# Patient Record
Sex: Male | Born: 1995 | Hispanic: Yes | Marital: Single | Smoking: Never smoker
Health system: Southern US, Community
[De-identification: ages and names within clinical notes are randomized; demographics above are authoritative.]

---

## 2019-12-25 ENCOUNTER — Emergency Department (HOSPITAL_COMMUNITY)
Admission: EM | Admit: 2019-12-25 | Discharge: 2019-12-25 | Disposition: A | Discharge status: Home / Self Care | Payer: Self-pay | Attending: Emergency Medicine | Admitting: Emergency Medicine

## 2019-12-25 ENCOUNTER — Other Ambulatory Visit: Payer: Self-pay

## 2019-12-25 ENCOUNTER — Emergency Department (HOSPITAL_COMMUNITY): Payer: Self-pay

## 2019-12-25 ENCOUNTER — Encounter (HOSPITAL_COMMUNITY): Payer: Self-pay | Admitting: Emergency Medicine

## 2019-12-25 DIAGNOSIS — I951 Orthostatic hypotension: Secondary | ICD-10-CM

## 2019-12-25 DIAGNOSIS — Z20822 Contact with and (suspected) exposure to covid-19: Secondary | ICD-10-CM | POA: Insufficient documentation

## 2019-12-25 DIAGNOSIS — R059 Cough, unspecified: Secondary | ICD-10-CM

## 2019-12-25 DIAGNOSIS — R9431 Abnormal electrocardiogram [ECG] [EKG]: Secondary | ICD-10-CM

## 2019-12-25 DIAGNOSIS — J069 Acute upper respiratory infection, unspecified: Secondary | ICD-10-CM

## 2019-12-25 LAB — CBC WITH DIFFERENTIAL/PLATELET
Abs Immature Granulocytes: 0.03 10*3/uL (ref 0.00–0.07)
Basophils Absolute: 0.1 10*3/uL (ref 0.0–0.1)
Basophils Relative: 1 %
Eosinophils Absolute: 0.4 10*3/uL (ref 0.0–0.5)
Eosinophils Relative: 3 %
HCT: 46.6 % (ref 39.0–52.0)
Hemoglobin: 15.6 g/dL (ref 13.0–17.0)
Immature Granulocytes: 0 %
Lymphocytes Relative: 14 %
Lymphs Abs: 1.7 10*3/uL (ref 0.7–4.0)
MCH: 28.9 pg (ref 26.0–34.0)
MCHC: 33.5 g/dL (ref 30.0–36.0)
MCV: 86.5 fL (ref 80.0–100.0)
Monocytes Absolute: 0.9 10*3/uL (ref 0.1–1.0)
Monocytes Relative: 8 %
Neutro Abs: 8.9 10*3/uL — ABNORMAL HIGH (ref 1.7–7.7)
Neutrophils Relative %: 74 %
Platelets: 296 10*3/uL (ref 150–400)
RBC: 5.39 MIL/uL (ref 4.22–5.81)
RDW: 12 % (ref 11.5–15.5)
WBC: 11.9 10*3/uL — ABNORMAL HIGH (ref 4.0–10.5)
nRBC: 0 % (ref 0.0–0.2)

## 2019-12-25 LAB — COMPREHENSIVE METABOLIC PANEL
ALT: 20 U/L (ref 0–44)
AST: 23 U/L (ref 15–41)
Albumin: 4.7 g/dL (ref 3.5–5.0)
Alkaline Phosphatase: 51 U/L (ref 38–126)
Anion gap: 14 (ref 5–15)
BUN: 7 mg/dL (ref 6–20)
CO2: 21 mmol/L — ABNORMAL LOW (ref 22–32)
Calcium: 9.3 mg/dL (ref 8.9–10.3)
Chloride: 104 mmol/L (ref 98–111)
Creatinine, Ser: 0.74 mg/dL (ref 0.61–1.24)
GFR calc Af Amer: >60 mL/min (ref >60)
GFR calc non Af Amer: >60 mL/min (ref >60)
Glucose, Bld: 132 mg/dL — ABNORMAL HIGH (ref 70–99)
Potassium: 3.6 mmol/L (ref 3.5–5.1)
Sodium: 139 mmol/L (ref 135–145)
Total Bilirubin: 0.7 mg/dL (ref 0.3–1.2)
Total Protein: 8 g/dL (ref 6.5–8.1)

## 2019-12-25 LAB — TSH: TSH: 0.932 u[IU]/mL (ref 0.350–4.500)

## 2019-12-25 LAB — MAGNESIUM: Magnesium: 1.9 mg/dL (ref 1.7–2.4)

## 2019-12-25 LAB — SARS CORONAVIRUS 2 BY RT PCR (HOSPITAL ORDER, PERFORMED IN ~~LOC~~ HOSPITAL LAB): SARS Coronavirus 2: NEGATIVE

## 2019-12-25 LAB — GROUP A STREP BY PCR: Group A Strep by PCR: DETECTED — AB

## 2019-12-25 LAB — TROPONIN I (HIGH SENSITIVITY): Troponin I (High Sensitivity): <2 ng/L (ref <18)

## 2019-12-25 MED ORDER — LIDOCAINE VISCOUS HCL 2 % MT SOLN
15.0000 mL | Freq: Once | OROMUCOSAL | Status: AC
  Administered 2019-12-25: 15 mL via OROMUCOSAL
  Filled 2019-12-25: qty 15

## 2019-12-25 MED ORDER — SODIUM CHLORIDE 0.9 % IV BOLUS
1000.0000 mL | Freq: Once | INTRAVENOUS | Status: AC
  Administered 2019-12-25: 1000 mL via INTRAVENOUS

## 2019-12-25 MED ORDER — KETOROLAC TROMETHAMINE 30 MG/ML IJ SOLN
30.0000 mg | Freq: Once | INTRAMUSCULAR | Status: AC
  Administered 2019-12-25: 30 mg via INTRAVENOUS
  Filled 2019-12-25: qty 1

## 2019-12-25 NOTE — ED Triage Notes (Signed)
Pt reports not feeling well for the last 3 days, with feeling heart racing, and weakenss. Reports subjective fever. No sick contacts. Denies hx of medical problems. Pt reports taking cold medicine. Pt reports stuffy nose, congestion. No improvement with OTC meds. NAD at present.

## 2019-12-25 NOTE — ED Notes (Signed)
Pt unable to stand for three minutes. Pt's HR elevated to the 140s. Pt asked to lay on bed. Pt's pressure taken right after he laid on the bed from standing for almost three minutes. Pt's pressure was 87/62. Pt stated that he "felt dizzy".

## 2019-12-25 NOTE — Discharge Instructions (Addendum)
You were seen in the ER for headache, sore throat, cough, light headedness  First EKG showed subtle abnormalities, we recommend cardiology follow up.  DO NOT EXERT YOURSELF, EXERCISE OR INCREASE YOUR HEART RATE.  DO NOT DRIVE. If you are not able to see cardiologist please see primary care doctor as soon as possible  Return immediately to ER for chest pain shortness or passing out during exercise   Stay hydrated drink 2 liters every day  Take ibuprofen (advil, motrin) every 6-8 hours for headache, sore throat  Return for worsening symptoms, chest pain, shortness of breath with exertion   Your COVID test is negative. Strep throat test is negative.   Strep test is pending - you will be called if this is positive and antibiotics will be sent to your pharmacy.  If you do not receive a call by the end of the day, assume your test is negative   Lo vieron en la sala de emergencias por dolor de cabeza, dolor de garganta, tos, [employer] anomalas sutiles, recomendamos seguimiento cardiolgico. NO HAGA ESFUERZO, NO HAGA EJERCICIO O AUMENTE SU RITMO CARDACO. NO MANEJES. Tiene riesgo de desmallarse.    Llame a la clinica de cardiologia para hacer cita.  Si no puede ver al cardilogo, consulte al mdico de atencin primaria lo antes posible.  Regrese inmediatamente a la sala de emergencias si tiene dolor en el pecho o si se desmaya durante el ejercicio.  Mantente hidratado beber 2 litros todos los [address] ibuprofeno (advil, motrin) cada 6 a 8 horas para el dolor de [place] y de Advertising copywriter.  Retorno por empeoramiento de los sntomas, dolor en el pecho, dificultad para respirar con el esfuerzo  Su prueba de COVID es negativa.   La prueba de estreptococos est pendiente; se le llamar si da positivo y se le enviarn antibiticos a su farmacia. Si no recibe [employer] al final del da, asuma que su prueba es negativa

## 2019-12-25 NOTE — ED Provider Notes (Addendum)
[facility] EMERGENCY DEPARTMENT Provider Note   CSN: [id number] Arrival date & time: 12/25/19  4967     History Chief Complaint  Patient presents with  . Tachycardia  . Weakness    Tim Henson is a 24 y.o. male presents to the ER for evaluation of feeling generalized weakness.  Describes it as feeling lightheaded like he is going to faint.  Has been feeling poorly for the last 3 days.  Reports associated sore throat with swallowing, dry cough, headaches, decreased appetite.  He has not eaten in the last 24 hours because he has not been hungry.  Has been taken triple X cold medicine over-the-counter for symptoms.  Patient was in the waiting room and had a witnessed syncopal episode.  He tells me that he was sitting down on his chair and suddenly felt weak and fainted.  Currently just feels tired.  Denies associated chest pain, shortness of breath.  Did have some palpitations during the syncopal episode but these have resolved.  Denies associated congestion, rhinorrhea, nausea, vomiting, abdominal pain, changes in bowel movements.  No dysuria or urinary symptoms.  Denies chest pain or shortness of breath.  On vaccinated for COVID-19.  No sick contacts.  No recent travel.  He is originally from [place].  Thinks he was vaccinated fully as a child.  Denies illicit drug use.  Denies alcohol use or tobacco use.  Obtained history directly from patient.  He is Spanish-speaking only.  HPI     No past medical history on file.  There are no problems to display for this patient.   ** The histories are not reviewed yet. Please review them in the "History" navigator section and refresh this SmartLink.     No family history on file.  Social History   Tobacco Use  . Smoking status: Never Smoker  Substance Use Topics  . Alcohol use: Not Currently  . Drug use: Not Currently    Home Medications Prior to Admission medications   Not on File    Allergies    Patient has  no known allergies.  Review of Systems   Review of Systems  Cardiovascular: Positive for palpitations.  Neurological: Positive for syncope and light-headedness.    Physical Exam Updated Vital Signs BP 123/77   Pulse (!) 114   Temp 98.5 F (36.9 C) (Oral)   Resp 19   Ht 5\' 6"  (1.676 m)   Wt 59 kg   SpO2 97%   BMI 20.98 kg/m   Physical Exam Vitals and nursing note reviewed.  Constitutional:      General: He is not in acute distress.    Appearance: He is well-developed.     Comments: NAD.  Nontoxic.  HENT:     Head: Normocephalic and atraumatic.     Right Ear: External ear normal.     Left Ear: External ear normal.     Nose: Nose normal.     Mouth/Throat:     Pharynx: Posterior oropharyngeal erythema present.     Comments: Mild erythema and tonsillar hypertrophy bilaterally, symmetric.  No exudates.  Uvula midline.  No trismus.  Dry lips but moist mucous membranes.  Normal phonation, tolerating secretions. Eyes:     General: No scleral icterus.    Conjunctiva/sclera: Conjunctivae normal.  Cardiovascular:     Rate and Rhythm: Regular rhythm. Tachycardia present.     Heart sounds: Normal heart sounds.     Comments: Regular rhythm.  Tachycardic in the 110s. Pulmonary:  Effort: Pulmonary effort is normal.     Breath sounds: Normal breath sounds.  Abdominal:     Palpations: Abdomen is soft.     Tenderness: There is no abdominal tenderness.  Musculoskeletal:        General: No deformity. Normal range of motion.     Cervical back: Normal range of motion and neck supple.  Skin:    General: Skin is warm and dry.     Capillary Refill: Capillary refill takes less than 2 seconds.  Neurological:     Mental Status: He is alert and oriented to person, place, and time.  Psychiatric:        Behavior: Behavior normal.        Thought Content: Thought content normal.        Judgment: Judgment normal.     ED Results / Procedures / Treatments   Labs (all labs ordered are  listed, but only abnormal results are displayed) Labs Reviewed  CBC WITH DIFFERENTIAL/PLATELET - Abnormal; Notable for the following components:      Result Value   WBC 11.9 (*)    Neutro Abs 8.9 (*)    All other components within normal limits  COMPREHENSIVE METABOLIC PANEL - Abnormal; Notable for the following components:   CO2 21 (*)    Glucose, Bld 132 (*)    All other components within normal limits  SARS CORONAVIRUS 2 BY RT PCR (HOSPITAL ORDER, PERFORMED IN [facility])  GROUP A STREP BY PCR  MAGNESIUM  TSH  TROPONIN I (HIGH SENSITIVITY)    EKG EKG Interpretation  Date/Time:  Monday December 25 2019 08:22:57 EDT Ventricular Rate:  136 PR Interval:  134 QRS Duration: 92 QT Interval:  284 QTC Calculation: 427 R Axis:   12 Text Interpretation: Sinus tachycardia ST & T wave abnormality, consider inferior ischemia Abnormal ECG Confirmed by Blane Ohara [phone]) on 12/25/2019 11:06:32 AM   Radiology DG Chest Portable 1 View  Result Date: 12/25/2019 CLINICAL DATA:  Palpitations EXAM: PORTABLE CHEST 1 VIEW COMPARISON:  None. FINDINGS: The cardiomediastinal contours are within normal limits. The lungs are clear. No pneumothorax or pleural effusion. No acute finding in the visualized skeleton. IMPRESSION: No acute cardiopulmonary finding. Electronically Signed   By: Emmaline Kluver M.D.   On: 12/25/2019 10:58    Procedures Procedures (including critical care time)  Medications Ordered in ED Medications  sodium chloride 0.9 % bolus 1,000 mL (1,000 mLs Intravenous New Bag/Given 12/25/19 1020)  lidocaine (XYLOCAINE) 2 % viscous mouth solution 15 mL (15 mLs Mouth/Throat Given 12/25/19 1056)  ketorolac (TORADOL) 30 MG/ML injection 30 mg (30 mg Intravenous Given 12/25/19 1058)  sodium chloride 0.9 % bolus 1,000 mL (1,000 mLs Intravenous New Bag/Given 12/25/19 1102)    ED Course  I have reviewed the triage vital signs and the nursing notes.  Pertinent labs &  imaging results that were available during my care of the patient were reviewed by me and considered in my medical decision making (see chart for details).  Clinical Course as of Dec 25 1342  Mon Dec 25, 2019  1017 Glucose(!): 132 [CG]  1017 WBC(!): 11.9 [CG]  1137 SARS Coronavirus 2: NEGATIVE [CG]    Clinical Course User Index [CG] Liberty Handy, PA-C   MDM Rules/Calculators/A&P                          24 yo M here for generalized weakness, headache, dry cough, decreased  appetite.  Weakness worse with sitting and standing.  No associated CP, SOB. Unvaccinated for COVID.  EMR, triage and nursing notes reviewed to assist with history and MDM.   Patient reportedly had a near syncope or syncope episode in [place].  Was helped up from the ground by staff and BP was checked 87/43, SpO2 96% and HR 100.  Brought to a room.  Orthostatics positive after 3 min of standing became hypotensive SBP 80s and tachycardia 140s, symptomatic with dizziness.   Exam otherwise actually very benign. VS normal during exam.  Non toxic. Appears tired. Reports weakness but no other symptoms currently.  No cardiac murmurs. Appears slightly dehydrated.  ER work up initiated in triage by Charity fundraiser. I have personally visualized and interpreted these.   ER work up vastly reassuring.    Minimal leukocytosis WBC 11.9. CBG 132. LFTs, creatinine, hemoglobin all normal. CXR without acute CP findings.    Initial EKG with HR 130s with non specific ST abnormalities in inferior/lateral leads, normal intervals.  ?delta wave. Patient endorsing positional light headedness however no Cp, SOB.  Denies previous cardiac symptoms with exertion, syncope.  Discussed and reviewed EKG with EDP Zavits. ?Delta wave. Will repeat EKG after IVF.     Ordered continuous cardiac and pulse ox monitoring.   Will give 2 L IVF, toradol. Will closely monitor, repeat EKG and repeat orthostatics after IVF.  COVID, strep pending.   1258: COVID  surprisingly negative. Trop undetectable.  Repeat rate better on EKG but persistent delta wave.  Again asked patient if he had any exertional symptoms in the past but denies this. No issues with exercise.  Discussed at length importance of following up with cardiology.  Recommended no exercise, driving. Will send Trish with cards a message.  shared with EDP, no indication for admission. Repeat orthostatics improved, normal.  Strep pending at dc will call patient with results.   Final Clinical Impression(s) / ED Diagnoses Final diagnoses:  Orthostatic hypotension  Cough in adult  Abnormal EKG  Viral upper respiratory infection    Rx / DC Orders ED Discharge Orders    None       Liberty Handy, PA-C 12/25/19 1352    Blane Ohara, MD 12/26/19 [phone]

## 2019-12-25 NOTE — ED Notes (Signed)
Discharge instructions discussed with patient via interpreter, discharged from ED in NAD

## 2019-12-25 NOTE — ED Notes (Addendum)
I didn't see pt pass out. I walked by pt checking to see what his name was to see if he need a vitals reassessed. He was not on the list I walked away but  pt seems okay. When I got back up front an employee from upstairs helped him to the ground and drew and I came over to get pt vitals. Pt BP was 87/43 o2 96 and hr 100. Pt was not out long pt did not respond to me because he only speaks spanish. Pt did answer drew when he spoke to him in spanish.

## 2020-06-05 ENCOUNTER — Emergency Department (HOSPITAL_COMMUNITY)
Admission: EM | Admit: 2020-06-05 | Discharge: 2020-06-05 | Disposition: A | Discharge status: Home / Self Care | Payer: Self-pay | Attending: Emergency Medicine | Admitting: Emergency Medicine

## 2020-06-05 ENCOUNTER — Emergency Department (HOSPITAL_COMMUNITY): Payer: Self-pay

## 2020-06-05 ENCOUNTER — Other Ambulatory Visit: Payer: Self-pay

## 2020-06-05 DIAGNOSIS — Z9889 Other specified postprocedural states: Secondary | ICD-10-CM | POA: Insufficient documentation

## 2020-06-05 DIAGNOSIS — R079 Chest pain, unspecified: Secondary | ICD-10-CM | POA: Insufficient documentation

## 2020-06-05 DIAGNOSIS — R110 Nausea: Secondary | ICD-10-CM | POA: Insufficient documentation

## 2020-06-05 LAB — BASIC METABOLIC PANEL
Anion gap: 11 (ref 5–15)
BUN: 10 mg/dL (ref 6–20)
CO2: 23 mmol/L (ref 22–32)
Calcium: 9.5 mg/dL (ref 8.9–10.3)
Chloride: 102 mmol/L (ref 98–111)
Creatinine, Ser: 0.57 mg/dL — ABNORMAL LOW (ref 0.61–1.24)
GFR, Estimated: >60 mL/min (ref >60)
Glucose, Bld: 105 mg/dL — ABNORMAL HIGH (ref 70–99)
Potassium: 3.6 mmol/L (ref 3.5–5.1)
Sodium: 136 mmol/L (ref 135–145)

## 2020-06-05 LAB — CBC
HCT: 44.6 % (ref 39.0–52.0)
Hemoglobin: 15 g/dL (ref 13.0–17.0)
MCH: 29.2 pg (ref 26.0–34.0)
MCHC: 33.6 g/dL (ref 30.0–36.0)
MCV: 86.9 fL (ref 80.0–100.0)
Platelets: 299 10*3/uL (ref 150–400)
RBC: 5.13 MIL/uL (ref 4.22–5.81)
RDW: 11.9 % (ref 11.5–15.5)
WBC: 6.4 10*3/uL (ref 4.0–10.5)
nRBC: 0 % (ref 0.0–0.2)

## 2020-06-05 LAB — TROPONIN I (HIGH SENSITIVITY)
Troponin I (High Sensitivity): 21 ng/L — ABNORMAL HIGH (ref <18)
Troponin I (High Sensitivity): 22 ng/L — ABNORMAL HIGH (ref <18)

## 2020-06-05 NOTE — ED Triage Notes (Signed)
Spanish interpreter used: Pt had an ablation done on Monday, c/o chest pain, and feels like his heart is racing.

## 2020-06-05 NOTE — Discharge Instructions (Addendum)
We recommend that you follow-up with your cardiologist later this week  as scheduled. Try to rest and take it easy.   Return here for new concerns.

## 2020-06-05 NOTE — ED Provider Notes (Signed)
[facility] EMERGENCY DEPARTMENT Provider Note   CSN: [id number] Arrival date & time: 06/05/20  1538     History Chief Complaint  Patient presents with  . Chest Pain    Tim Henson is a 25 y.o. male.  The history is provided by the patient and medical records. The history is limited by a language barrier. A language interpreter was used.  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 Gary Fleet at [facility] presenting to the ED with chest pain.  States this started yesterday and continues today.  He states it feels sharp, like something is stabbing him in the chest.  He states he had brief episode of nausea earlier today when he was trying to eat but he has been eating/drinking today without issue.  He denies SOB.  Pain does not worsen with movement or breathing.  States he did have some palpitations earlier today but none currently.  He denies any other medical problems.  No prior cardiac history aside from WPW and ablation.  Currently, he is eating [employer].  No past medical history on file.  There are no problems to display for this patient.     No family history on file.     Home Medications Prior to Admission medications   Not on File    Allergies    Patient has no known allergies.  Review of Systems   Review of Systems  Cardiovascular: Positive for chest pain.  All other systems reviewed and are negative.   Physical Exam Updated Vital Signs BP 128/82 (BP Location: Right Arm)   Pulse 74   Temp 98.8 F (37.1 C) (Oral)   Resp 20   SpO2 96%   Physical Exam Vitals and nursing note reviewed.  Constitutional:      Appearance: He is well-developed and well-nourished.     Comments: Eating [employer], NAD noted  HENT:     Head: Normocephalic and atraumatic.     Mouth/Throat:     Mouth: Oropharynx is clear and moist.  Eyes:     Extraocular Movements: EOM normal.     Conjunctiva/sclera:  Conjunctivae normal.     Pupils: Pupils are equal, round, and reactive to light.  Cardiovascular:     Rate and Rhythm: Normal rate and regular rhythm.     Heart sounds: Normal heart sounds.  Pulmonary:     Effort: Pulmonary effort is normal.     Breath sounds: Normal breath sounds. No wheezing or rhonchi.  Abdominal:     General: Bowel sounds are normal.     Palpations: Abdomen is soft.  Musculoskeletal:        General: Normal range of motion.     Cervical back: Normal range of motion.     Comments: No leg or calf swelling present  Skin:    General: Skin is warm and dry.  Neurological:     Mental Status: He is alert and oriented to person, place, and time.  Psychiatric:        Mood and Affect: Mood and affect normal.     ED Results / Procedures / Treatments   Labs (all labs ordered are listed, but only abnormal results are displayed) Labs Reviewed  BASIC METABOLIC PANEL - Abnormal; Notable for the following components:      Result Value   Glucose, Bld 105 (*)    Creatinine, Ser 0.57 (*)    All other  components within normal limits  TROPONIN I (HIGH SENSITIVITY) - Abnormal; Notable for the following components:   Troponin I (High Sensitivity) 21 (*)    All other components within normal limits  TROPONIN I (HIGH SENSITIVITY) - Abnormal; Notable for the following components:   Troponin I (High Sensitivity) 22 (*)    All other components within normal limits  CBC    EKG EKG Interpretation  Date/Time:  Wednesday June 05 2020 15:59:51 EST Ventricular Rate:  91 PR Interval:  146 QRS Duration: 88 QT Interval:  328 QTC Calculation: 403 R Axis:   76 Text Interpretation: Normal sinus rhythm Normal ECG Confirmed by Gerhard Munch [phone]) on 06/05/2020 10:44:32 PM   Radiology DG Chest 2 View  Result Date: 06/05/2020 CLINICAL DATA:  One day of chest pain. EXAM: CHEST - 2 VIEW COMPARISON:  None. FINDINGS: The heart size and mediastinal contours are within normal limits. Both  lungs are clear. The visualized skeletal structures are unremarkable. IMPRESSION: No active cardiopulmonary disease. Electronically Signed   By: Maudry Mayhew MD   On: 06/05/2020 16:45    Procedures Procedures   Medications Ordered in ED Medications - No data to display  ED Course  I have reviewed the triage vital signs and the nursing notes.  Pertinent labs & imaging results that were available during my care of the patient were reviewed by me and considered in my medical decision making (see chart for details).    MDM Rules/Calculators/A&P  25 y.o. M here with chest pain.  He is about 48 hours s/p cardiac ablation Monday 06/03/20 at [facility].  He states procedure went well without complications.  Started having pain yesterday and continued today.  States he did have some palpitations earlier today, none currently.  Denies SOB or pleuritic type pain.  He is afebrile, non-toxic, currently eating [employer].  He denies current symptoms.  EKG NSR, no ischemia.  Labs as above-- trops minimally elevated but flat at 21 and 22 respectively.  Suspect this is related to his ablation and not ACS.  He is not tachycardic, hypoxic, no SOB or LE swelling so feel PE and CHF less likely as well.  Feel he is stable for discharge.  He reports he has follow-up with his cardiologist on Friday (in 48 hours) for re-check.  May return here for any new/acute changes.  Final Clinical Impression(s) / ED Diagnoses Final diagnoses:  Chest pain in adult    Rx / DC Orders ED Discharge Orders    None       Garlon Hatchet, PA-C 06/05/20 2259    Gerhard Munch, MD 06/05/20 2320

## 2020-06-19 ENCOUNTER — Emergency Department (HOSPITAL_COMMUNITY)
Admission: EM | Admit: 2020-06-19 | Discharge: 2020-06-20 | Disposition: A | Discharge status: Home / Self Care | Payer: Self-pay | Attending: Emergency Medicine | Admitting: Emergency Medicine

## 2020-06-19 ENCOUNTER — Emergency Department (HOSPITAL_COMMUNITY): Payer: Self-pay

## 2020-06-19 DIAGNOSIS — R110 Nausea: Secondary | ICD-10-CM | POA: Insufficient documentation

## 2020-06-19 DIAGNOSIS — R0602 Shortness of breath: Secondary | ICD-10-CM | POA: Insufficient documentation

## 2020-06-19 DIAGNOSIS — R531 Weakness: Secondary | ICD-10-CM | POA: Insufficient documentation

## 2020-06-19 DIAGNOSIS — R4182 Altered mental status, unspecified: Secondary | ICD-10-CM | POA: Insufficient documentation

## 2020-06-19 MED ORDER — SODIUM CHLORIDE 0.9 % IV BOLUS
500.0000 mL | Freq: Once | INTRAVENOUS | Status: AC
  Administered 2020-06-19: 500 mL via INTRAVENOUS

## 2020-06-19 NOTE — ED Triage Notes (Signed)
Pt BIB his brother who reports that pt became altered today after drinking two beers. Unclear if he used any other substances. Pt awake, not answering questions. Brother reports pt had recent heart surgery, unsure what type of surgery.

## 2020-06-19 NOTE — ED Provider Notes (Signed)
[facility] EMERGENCY DEPARTMENT Provider Note   CSN: [id number] Arrival date & time: 06/19/20  2205     History Chief Complaint  Patient presents with  . Altered Mental Status    Tim Henson is a 25 y.o. male.  Patient with history of WPW, recent successful ablation [facility], 06/04/20), presents with sudden onset of SOB and generalized weakness without syncope that started just prior to arrival. The patient denies any chest pain at onset of symptoms or any pain now. He reports mild nausea. His brother states they went to a bar after work and each had 2 shots of tequila, after which the patient "turned purple" and reported significant dyspnea. He had to be carried to the car because of weakness. No recent fever, cough. He does not smoke. No history of asthma, or albuterol use.   The history is provided by the patient. A language interpreter was used.  Altered Mental Status Associated symptoms: nausea and weakness   Associated symptoms: no fever        No past medical history on file.  There are no problems to display for this patient.  No family history on file.  Social History   Tobacco Use  . Smoking status: Never Smoker  Substance Use Topics  . Alcohol use: Not Currently  . Drug use: Not Currently    Home Medications Prior to Admission medications   Not on File    Allergies    Patient has no known allergies.  Review of Systems   Review of Systems  Constitutional: Negative for chills and fever.  HENT: Negative.   Respiratory: Positive for shortness of breath.   Cardiovascular: Negative.  Negative for chest pain.  Gastrointestinal: Positive for nausea.  Musculoskeletal: Negative.   Skin: Positive for color change.  Neurological: Positive for weakness.    Physical Exam Updated Vital Signs BP 122/75   Pulse 96   Temp 98.3 F (36.8 C) (Oral)   Resp 15   SpO2 97%   Physical Exam Vitals and nursing note reviewed.   Constitutional:      Appearance: He is well-developed.  HENT:     Head: Normocephalic.  Cardiovascular:     Rate and Rhythm: Normal rate and regular rhythm.     Heart sounds: No murmur heard.   Pulmonary:     Effort: Pulmonary effort is normal. No respiratory distress.     Breath sounds: Normal breath sounds. No wheezing, rhonchi or rales.  Abdominal:     General: Bowel sounds are normal.     Palpations: Abdomen is soft.     Tenderness: There is no abdominal tenderness. There is no guarding or rebound.  Musculoskeletal:        General: Normal range of motion.     Cervical back: Normal range of motion and neck supple.     Right lower leg: No edema.     Left lower leg: No edema.  Skin:    General: Skin is warm and dry.     Findings: No rash.  Neurological:     Mental Status: He is alert and oriented to person, place, and time.     ED Results / Procedures / Treatments   Labs (all labs ordered are listed, but only abnormal results are displayed) Labs Reviewed  CBC - Abnormal; Notable for the following components:      Result Value   Platelets 416 (*)    All other components within normal limits  URINALYSIS, ROUTINE W REFLEX MICROSCOPIC - Abnormal; Notable for the following components:   Color, Urine STRAW (*)    Specific Gravity, Urine 1.004 (*)    Hgb urine dipstick SMALL (*)    All other components within normal limits  ETHANOL - Abnormal; Notable for the following components:   Alcohol, Ethyl (B) 243 (*)    All other components within normal limits  COMPREHENSIVE METABOLIC PANEL - Abnormal; Notable for the following components:   Glucose, Bld 115 (*)    Creatinine, Ser 0.58 (*)    Total Protein 8.2 (*)    AST 93 (*)    ALT 196 (*)    All other components within normal limits  RAPID URINE DRUG SCREEN, HOSP PERFORMED  D-DIMER, QUANTITATIVE    EKG EKG Interpretation  Date/Time:  Wednesday June 19 2020 22:15:43 EDT Ventricular Rate:  104 PR  Interval:  158 QRS Duration: 88 QT Interval:  328 QTC Calculation: 431 R Axis:   72 Text Interpretation: Sinus tachycardia Otherwise normal ECG Confirmed by Ross Marcus ([id number]) on 06/19/2020 11:17:30 PM   Radiology DG Chest Portable 1 View  Result Date: 06/19/2020 CLINICAL DATA:  Altered mental status. EXAM: PORTABLE CHEST 1 VIEW COMPARISON:  December 25, 2019 FINDINGS: The heart size and mediastinal contours are within normal limits. Both lungs are clear. The visualized skeletal structures are unremarkable. IMPRESSION: No active disease. Electronically Signed   By: Aram Candela M.D.   On: 06/19/2020 23:29    Procedures    Medications Ordered in ED Medications  sodium chloride 0.9 % bolus 500 mL (0 mLs Intravenous Stopped 06/20/20 0001)    ED Course  I have reviewed the triage vital signs and the nursing notes.  Pertinent labs & imaging results that were available during my care of the patient were reviewed by me and considered in my medical decision making (see chart for details).    MDM Rules/Calculators/A&P                          Patient with h/o WPW, recent ablation at [facility] on 06/04/20, presents with sudden onset of SOB tonight. No pain, syncope.  Chart reviewed. Successful ablation on 3/1 by Dr. Gary Fleet at [facility]. No arrhythmia on EKG. Tachycardic on arrival that resolved prior to IVF's.   The patient reports progressive improvement in symptoms. He is ambulated without recurrent symptoms, no hypoxia or tachycardia.   He is felt appropriate for discharge home. He is encouraged to follow up with Dr. Gary Fleet for recheck in the outpatient setting.    Final Clinical Impression(s) / ED Diagnoses Final diagnoses:  Shortness of breath    Rx / DC Orders ED Discharge Orders    None       Elpidio Anis, PA-C 06/20/20 0206    Shon Baton, MD 06/20/20 [phone]

## 2020-06-20 LAB — COMPREHENSIVE METABOLIC PANEL
ALT: 196 U/L — ABNORMAL HIGH (ref 0–44)
AST: 93 U/L — ABNORMAL HIGH (ref 15–41)
Albumin: 4.8 g/dL (ref 3.5–5.0)
Alkaline Phosphatase: 65 U/L (ref 38–126)
Anion gap: 11 (ref 5–15)
BUN: 11 mg/dL (ref 6–20)
CO2: 23 mmol/L (ref 22–32)
Calcium: 9.5 mg/dL (ref 8.9–10.3)
Chloride: 107 mmol/L (ref 98–111)
Creatinine, Ser: 0.58 mg/dL — ABNORMAL LOW (ref 0.61–1.24)
GFR, Estimated: >60 mL/min (ref >60)
Glucose, Bld: 115 mg/dL — ABNORMAL HIGH (ref 70–99)
Potassium: 3.7 mmol/L (ref 3.5–5.1)
Sodium: 141 mmol/L (ref 135–145)
Total Bilirubin: 0.4 mg/dL (ref 0.3–1.2)
Total Protein: 8.2 g/dL — ABNORMAL HIGH (ref 6.5–8.1)

## 2020-06-20 LAB — URINALYSIS, ROUTINE W REFLEX MICROSCOPIC
Bacteria, UA: NONE SEEN
Bilirubin Urine: NEGATIVE
Glucose, UA: NEGATIVE mg/dL
Ketones, ur: NEGATIVE mg/dL
Leukocytes,Ua: NEGATIVE
Nitrite: NEGATIVE
Protein, ur: NEGATIVE mg/dL
Specific Gravity, Urine: 1.004 — ABNORMAL LOW (ref 1.005–1.030)
pH: 7 (ref 5.0–8.0)

## 2020-06-20 LAB — CBC
HCT: 44.2 % (ref 39.0–52.0)
Hemoglobin: 15.6 g/dL (ref 13.0–17.0)
MCH: 29.4 pg (ref 26.0–34.0)
MCHC: 35.3 g/dL (ref 30.0–36.0)
MCV: 83.2 fL (ref 80.0–100.0)
Platelets: 416 10*3/uL — ABNORMAL HIGH (ref 150–400)
RBC: 5.31 MIL/uL (ref 4.22–5.81)
RDW: 11.9 % (ref 11.5–15.5)
WBC: 7.7 10*3/uL (ref 4.0–10.5)
nRBC: 0 % (ref 0.0–0.2)

## 2020-06-20 LAB — RAPID URINE DRUG SCREEN, HOSP PERFORMED
Amphetamines: NOT DETECTED
Barbiturates: NOT DETECTED
Benzodiazepines: NOT DETECTED
Cocaine: NOT DETECTED
Opiates: NOT DETECTED
Tetrahydrocannabinol: NOT DETECTED

## 2020-06-20 LAB — ETHANOL: Alcohol, Ethyl (B): 243 mg/dL — ABNORMAL HIGH (ref <10)

## 2020-06-20 LAB — D-DIMER, QUANTITATIVE: D-Dimer, Quant: <0.27 ug/mL-FEU (ref 0.00–0.50)

## 2020-06-20 NOTE — ED Notes (Signed)
Patient verbalizes understanding of discharge instructions using interputer. Opportunity for questioning and answers were provided. Armband removed by staff, pt discharged from ED ambulatory.

## 2020-06-20 NOTE — ED Notes (Signed)
Provided with warm blanket

## 2020-06-20 NOTE — Discharge Instructions (Addendum)
Please call your heart doctor tomorrow to schedule a time to be seen for recheck of the shortness of breath you had tonight.   Return to the ED with any new or concerning symptoms.

## 2020-06-20 NOTE — ED Notes (Signed)
Pt walking oximetry @ 97%.

## 2021-01-17 ENCOUNTER — Other Ambulatory Visit: Payer: Self-pay

## 2021-01-17 ENCOUNTER — Emergency Department (HOSPITAL_COMMUNITY)
Admission: EM | Admit: 2021-01-17 | Discharge: 2021-01-17 | Disposition: A | Discharge status: Home / Self Care | Payer: Self-pay | Attending: Emergency Medicine | Admitting: Emergency Medicine

## 2021-01-17 ENCOUNTER — Emergency Department (HOSPITAL_BASED_OUTPATIENT_CLINIC_OR_DEPARTMENT_OTHER): Payer: Self-pay

## 2021-01-17 ENCOUNTER — Emergency Department (HOSPITAL_COMMUNITY): Payer: Self-pay

## 2021-01-17 DIAGNOSIS — R0789 Other chest pain: Secondary | ICD-10-CM | POA: Insufficient documentation

## 2021-01-17 DIAGNOSIS — Z20822 Contact with and (suspected) exposure to covid-19: Secondary | ICD-10-CM | POA: Insufficient documentation

## 2021-01-17 DIAGNOSIS — R079 Chest pain, unspecified: Secondary | ICD-10-CM

## 2021-01-17 DIAGNOSIS — E876 Hypokalemia: Secondary | ICD-10-CM | POA: Insufficient documentation

## 2021-01-17 LAB — RESP PANEL BY RT-PCR (FLU A&B, COVID) ARPGX2
Influenza A by PCR: NEGATIVE
Influenza B by PCR: NEGATIVE
SARS Coronavirus 2 by RT PCR: NEGATIVE

## 2021-01-17 LAB — CBC WITH DIFFERENTIAL/PLATELET
Abs Immature Granulocytes: 0.02 10*3/uL (ref 0.00–0.07)
Basophils Absolute: 0.1 10*3/uL (ref 0.0–0.1)
Basophils Relative: 1 %
Eosinophils Absolute: 0.3 10*3/uL (ref 0.0–0.5)
Eosinophils Relative: 4 %
HCT: 43.2 % (ref 39.0–52.0)
Hemoglobin: 14.9 g/dL (ref 13.0–17.0)
Immature Granulocytes: 0 %
Lymphocytes Relative: 36 %
Lymphs Abs: 3.2 10*3/uL (ref 0.7–4.0)
MCH: 29.5 pg (ref 26.0–34.0)
MCHC: 34.5 g/dL (ref 30.0–36.0)
MCV: 85.5 fL (ref 80.0–100.0)
Monocytes Absolute: 0.8 10*3/uL (ref 0.1–1.0)
Monocytes Relative: 9 %
Neutro Abs: 4.5 10*3/uL (ref 1.7–7.7)
Neutrophils Relative %: 50 %
Platelets: 349 10*3/uL (ref 150–400)
RBC: 5.05 MIL/uL (ref 4.22–5.81)
RDW: 12.3 % (ref 11.5–15.5)
WBC: 9 10*3/uL (ref 4.0–10.5)
nRBC: 0 % (ref 0.0–0.2)

## 2021-01-17 LAB — TROPONIN I (HIGH SENSITIVITY)
Troponin I (High Sensitivity): 8 ng/L (ref <18)
Troponin I (High Sensitivity): 21 ng/L — ABNORMAL HIGH (ref <18)

## 2021-01-17 LAB — BASIC METABOLIC PANEL
Anion gap: 10 (ref 5–15)
BUN: 11 mg/dL (ref 6–20)
CO2: 25 mmol/L (ref 22–32)
Calcium: 9.5 mg/dL (ref 8.9–10.3)
Chloride: 101 mmol/L (ref 98–111)
Creatinine, Ser: 0.61 mg/dL (ref 0.61–1.24)
GFR, Estimated: >60 mL/min (ref >60)
Glucose, Bld: 119 mg/dL — ABNORMAL HIGH (ref 70–99)
Potassium: 3.2 mmol/L — ABNORMAL LOW (ref 3.5–5.1)
Sodium: 136 mmol/L (ref 135–145)

## 2021-01-17 LAB — ECHOCARDIOGRAM COMPLETE
Area-P 1/2: 3.31 cm2
Height: 65 in
S' Lateral: 3.2 cm
Weight: 1920 oz

## 2021-01-17 MED ORDER — POTASSIUM CHLORIDE CRYS ER 20 MEQ PO TBCR
40.0000 meq | EXTENDED_RELEASE_TABLET | Freq: Once | ORAL | Status: AC
  Administered 2021-01-17: 40 meq via ORAL
  Filled 2021-01-17: qty 2

## 2021-01-17 NOTE — Progress Notes (Signed)
Echocardiogram 2D Echocardiogram has been performed.  Warren Lacy Jina Olenick RDCS 01/17/2021, 8:54 AM

## 2021-01-17 NOTE — Discharge Instructions (Addendum)
Your workup was overall reassuring in the ED today. Please follow up with your cardiologist in the outpatient setting. Call to schedule an appointment.   You should also have your potassium level rechecked in 1-2 weeks as it was slightly decreased in the ED today. Please increase potassium in your diet (bananas, potatoes, leafy greens). If you do not have a PCP to follow up with you can follow with [facility]ness for further eval.   Return to the ED for any new/worsening symptoms

## 2021-01-17 NOTE — ED Triage Notes (Addendum)
Pt from home d/t chest pain that's been ongoing for the past three days. Pt endorsing no fevers or cough, but having chills and dizziness. Pt also stated that the chest pain is primarily on the left. Pt states dyspnea at rest and with exertion. Pt endorses prior cardiac hx with an ablation this past summer. Pt presents as A&Ox4, but with delayed responses.

## 2021-01-17 NOTE — ED Provider Notes (Signed)
[facility] EMERGENCY DEPARTMENT Provider Note   CSN: [id number] Arrival date & time: 01/17/21  0216     History Chief Complaint  Patient presents with   Chest Pain    Tim Henson is a 25 y.o. 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 Pt denies diaphoresis, SOB, nausea, vomiting. He feels like the pain is worse when laying flat. He has taken aspirin for his pain without relief. He is a never smoker. No other complaints at this time.   The history is provided by the patient and medical records. The history is limited by a language barrier. A language interpreter was used.      No past medical history on file.  There are no problems to display for this patient.   No family history on file.  Social History   Tobacco Use   Smoking status: Never  Substance Use Topics   Alcohol use: Not Currently   Drug use: Not Currently    Home Medications Prior to Admission medications   Not on File    Allergies    Patient has no known allergies.  Review of Systems   Review of Systems  Constitutional:  Negative for chills and fever.  Respiratory:  Negative for cough and shortness of breath.   Cardiovascular:  Positive for chest pain.  Gastrointestinal:  Negative for nausea and vomiting.  All other systems reviewed and are negative.  Physical Exam Updated Vital Signs BP 128/90   Pulse 88   Temp 98.7 F (37.1 C) (Oral)   Resp (!) 24   Ht 5\' 5"  (1.651 m)   Wt 54.4 kg   SpO2 97%   BMI 19.97 kg/m   Physical Exam Vitals and nursing note reviewed.  Constitutional:      Appearance: He is not ill-appearing or diaphoretic.  HENT:     Head: Normocephalic and atraumatic.  Eyes:     Conjunctiva/sclera: Conjunctivae normal.  Cardiovascular:     Rate and Rhythm: Normal rate and regular rhythm.      Pulses:          Radial pulses are 2+ on the right side and 2+ on the left side.     Heart sounds: Normal heart sounds.  Pulmonary:     Effort: Pulmonary effort is normal.     Breath sounds: Normal breath sounds. No decreased breath sounds, wheezing, rhonchi or rales.  Abdominal:     Palpations: Abdomen is soft.     Tenderness: There is no abdominal tenderness.  Musculoskeletal:     Cervical back: Neck supple.     Right lower leg: No edema.     Left lower leg: No edema.  Skin:    General: Skin is warm and dry.  Neurological:     Mental Status: He is alert.    ED Results / Procedures / Treatments   Labs (all labs ordered are listed, but only abnormal results are displayed) Labs Reviewed  BASIC METABOLIC PANEL - Abnormal; Notable for the following components:      Result Value   Potassium 3.2 (*)    Glucose, Bld 119 (*)    All other components within normal limits  TROPONIN I (HIGH SENSITIVITY) - Abnormal; Notable for the following components:   Troponin I (High Sensitivity) 21 (*)    All other components  within normal limits  RESP PANEL BY RT-PCR (FLU A&B, COVID) ARPGX2  CBC WITH DIFFERENTIAL/PLATELET  TROPONIN I (HIGH SENSITIVITY)    EKG None  Radiology DG Chest 2 View  Result Date: 01/17/2021 CLINICAL DATA:  Chest pain EXAM: CHEST - 2 VIEW COMPARISON:  06/19/2020 FINDINGS: The heart size and mediastinal contours are within normal limits. Both lungs are clear. The visualized skeletal structures are unremarkable. IMPRESSION: No active cardiopulmonary disease. Electronically Signed   By: Helyn Numbers M.D.   On: 01/17/2021 03:27   ECHOCARDIOGRAM COMPLETE  Result Date: 01/17/2021    ECHOCARDIOGRAM REPORT   Patient Name:   Tim Henson Date of Exam: 01/17/2021 Medical Rec #:  [id number]                   Height:       65.0 in Accession #:    [id number]                  Weight:       120.0 lb Date of Birth:  04-26-1995                  BSA:          1.592  m Patient Age:    24 years                    BP:           125/84 mmHg Patient Gender: M                           HR:           66 bpm. Exam Location:  Inpatient Procedure: 2D Echo, Color Doppler and Cardiac Doppler Indications:    R07.9* Chest pain, unspecified  History:        Patient has prior history of Echocardiogram examinations, most                 recent 05/20/2020. Arrythmias:WPW. Prior performed at [facility].  Sonographer:    Irving Burton Senior RDCS Referring Phys: [phone] Evanie Buckle IMPRESSIONS  1. Left ventricular ejection fraction, by estimation, is 60 to 65%. The left ventricle has normal function. The left ventricle has no regional wall motion abnormalities. Left ventricular diastolic parameters were normal.  2. Right ventricular systolic function is normal. The right ventricular size is normal.  3. The mitral valve is normal in structure. No evidence of mitral valve regurgitation. No evidence of mitral stenosis.  4. The aortic valve is normal in structure. Aortic valve regurgitation is not visualized. No aortic stenosis is present.  5. The inferior vena cava is normal in size with greater than 50% respiratory variability, suggesting right atrial pressure of 3 mmHg. FINDINGS  Left Ventricle: Left ventricular ejection fraction, by estimation, is 60 to 65%. The left ventricle has normal function. The left ventricle has no regional wall motion abnormalities. The left ventricular internal cavity size was normal in size. There is  no left ventricular hypertrophy. Left ventricular diastolic parameters were normal. Normal left ventricular filling pressure. Right Ventricle: The right ventricular size is normal. No increase in right ventricular wall thickness. Right ventricular systolic function is normal. Left Atrium: Left atrial size was normal in size. Right Atrium: Right atrial size was normal in size. Pericardium: There is no evidence of pericardial effusion. Mitral Valve: The mitral valve is normal in  structure. No evidence of mitral valve  regurgitation. No evidence of mitral valve stenosis. Tricuspid Valve: The tricuspid valve is normal in structure. Tricuspid valve regurgitation is not demonstrated. No evidence of tricuspid stenosis. Aortic Valve: The aortic valve is normal in structure. Aortic valve regurgitation is not visualized. No aortic stenosis is present. Pulmonic Valve: The pulmonic valve was normal in structure. Pulmonic valve regurgitation is not visualized. No evidence of pulmonic stenosis. Aorta: The aortic root is normal in size and structure. Venous: The inferior vena cava is normal in size with greater than 50% respiratory variability, suggesting right atrial pressure of 3 mmHg. IAS/Shunts: No atrial level shunt detected by color flow Doppler.  LEFT VENTRICLE PLAX 2D LVIDd:         4.60 cm   Diastology LVIDs:         3.20 cm   LV e' medial:    12.40 cm/s LV PW:         0.70 cm   LV E/e' medial:  5.7 LV IVS:        0.70 cm   LV e' lateral:   16.10 cm/s LVOT diam:     2.10 cm   LV E/e' lateral: 4.4 LV SV:         54 LV SV Index:   34 LVOT Area:     3.46 cm  RIGHT VENTRICLE RV S prime:     12.20 cm/s TAPSE (M-mode): 1.8 cm LEFT ATRIUM             Index        RIGHT ATRIUM           Index LA diam:        3.20 cm 2.01 cm/m   RA Area:     13.60 cm LA Vol (A2C):   37.8 ml 23.74 ml/m  RA Volume:   33.80 ml  21.23 ml/m LA Vol (A4C):   30.9 ml 19.41 ml/m LA Biplane Vol: 36.4 ml 22.86 ml/m  AORTIC VALVE LVOT Vmax:   92.45 cm/s LVOT Vmean:  61.500 cm/s LVOT VTI:    0.156 m  AORTA Ao Root diam: 2.90 cm Ao Asc diam:  2.70 cm MITRAL VALVE MV Area (PHT): 3.31 cm    SHUNTS MV Decel Time: 229 msec    Systemic VTI:  0.16 m MV E velocity: 70.90 cm/s  Systemic Diam: 2.10 cm MV A velocity: 44.50 cm/s MV E/A ratio:  1.59 Mihai Croitoru MD Electronically signed by Thurmon Fair MD Signature Date/Time: 01/17/2021/9:56:34 AM    Final     Procedures Procedures   Medications Ordered in ED Medications   potassium chloride SA (KLOR-CON) CR tablet 40 mEq (has no administration in time range)    ED Course  I have reviewed the triage vital signs and the nursing notes.  Pertinent labs & imaging results that were available during my care of the patient were reviewed by me and considered in my medical decision making (see chart for details).  Clinical Course as of 01/17/21 1026  Fri Jan 17, 2021  0727 Potassium(!): 3.2 [MV]    Clinical Course User Index [MV] Tanda Rockers, PA-C   MDM Rules/Calculators/A&P                           25 year old Spanish-speaking male who presents to the ED today with complaints of intermittent left-sided chest pain radiating into left arm for the past 5 days without any additional symptoms.  History of [employer]  status post ablation in February of this year.  On arrival to the ED today blood pressure slightly elevated at 151/98 with repeat 134/94.  Remainder vitals unremarkable.  Initial troponin of 8 with repeat at 21.  EKG unremarkable and chest x-ray clear.  Given elevation in troponin we will plan to touch base with cardiology with his history of ablation.   Discussed case with Dr. Royann Shivers with cardiology - EKGs unchanged from previous however will plan to contact Echo department to see if patient can have echo prior to being discharged to rule out pericarditis given pain worse with laying flat. Echo ordered.   ECHO read  by Dr. Royann Shivers - no abnormalities. He does not feel pt requires further workup for troponin level change from 8 > 21. Will discharge home at this time with cardiology follow up outpatient. Pt stable for discharge at this time.   This note was prepared using Dragon voice recognition software and may include unintentional dictation errors due to the inherent limitations of voice recognition software.   Final Clinical Impression(s) / ED Diagnoses Final diagnoses:  Nonspecific chest pain  Hypokalemia    Rx / DC Orders ED  Discharge Orders     None        Discharge Instructions      Your workup was overall reassuring in the ED today. Please follow up with your cardiologist in the outpatient setting. Call to schedule an appointment.   You should also have your potassium level rechecked in 1-2 weeks as it was slightly decreased in the ED today. Please increase potassium in your diet (bananas, potatoes, leafy greens). If you do not have a PCP to follow up with you can follow with [facility] and Wellness for further eval.   Return to the ED for any new/worsening symptoms       Tanda Rockers, PA-C 01/17/21 1026    Vanetta Mulders, MD 01/18/21 1510

## 2021-01-17 NOTE — ED Notes (Signed)
Using translator ipad, pt and family verbalized understanding of follow-up with PCP and cardiologist.

## 2021-01-17 NOTE — ED Provider Notes (Addendum)
Emergency Medicine Provider Triage Evaluation Note  Tim Henson , a 25 y.o. male  was evaluated in triage.  Pt complains of chest pain x 3 days with some SOB.  Denies cough, fever.  States he feels weak and dizzy when he closes his eyes.  Has history of WPW s/p ablation February 2022.  Review of Systems  Positive: Chest pain, SOB Negative: fever  Physical Exam  BP (!) 151/98 (BP Location: Right Arm)   Pulse 80   Temp 98.7 F (37.1 C) (Oral)   Resp 18   SpO2 99%   Gen:   Awake, no distress   Resp:  Normal effort  MSK:   Moves extremities without difficulty  Other:    Medical Decision Making  Medically screening exam initiated at 2:53 AM.  Appropriate orders placed.  Tim Henson was informed that the remainder of the evaluation will be completed by another provider, this initial triage assessment does not replace that evaluation, and the importance of remaining in the ED until their evaluation is complete.  Chest pain. Hx of WPW s/p ablation February 2022 at [facility].  VSS currently.  EKG, labs, CXR.    Garlon Hatchet, PA-C 01/17/21 Gennaro Africa    Geoffery Lyons, MD 01/17/21 [phone]

## 2021-06-30 IMAGING — DX DG CHEST 1V PORT
1 series · 1 of 1 positions shown · non-contrast
Comparison: December 25, 2019

CLINICAL DATA: Altered mental status.

EXAM:
PORTABLE CHEST 1 VIEW

[chest ap]
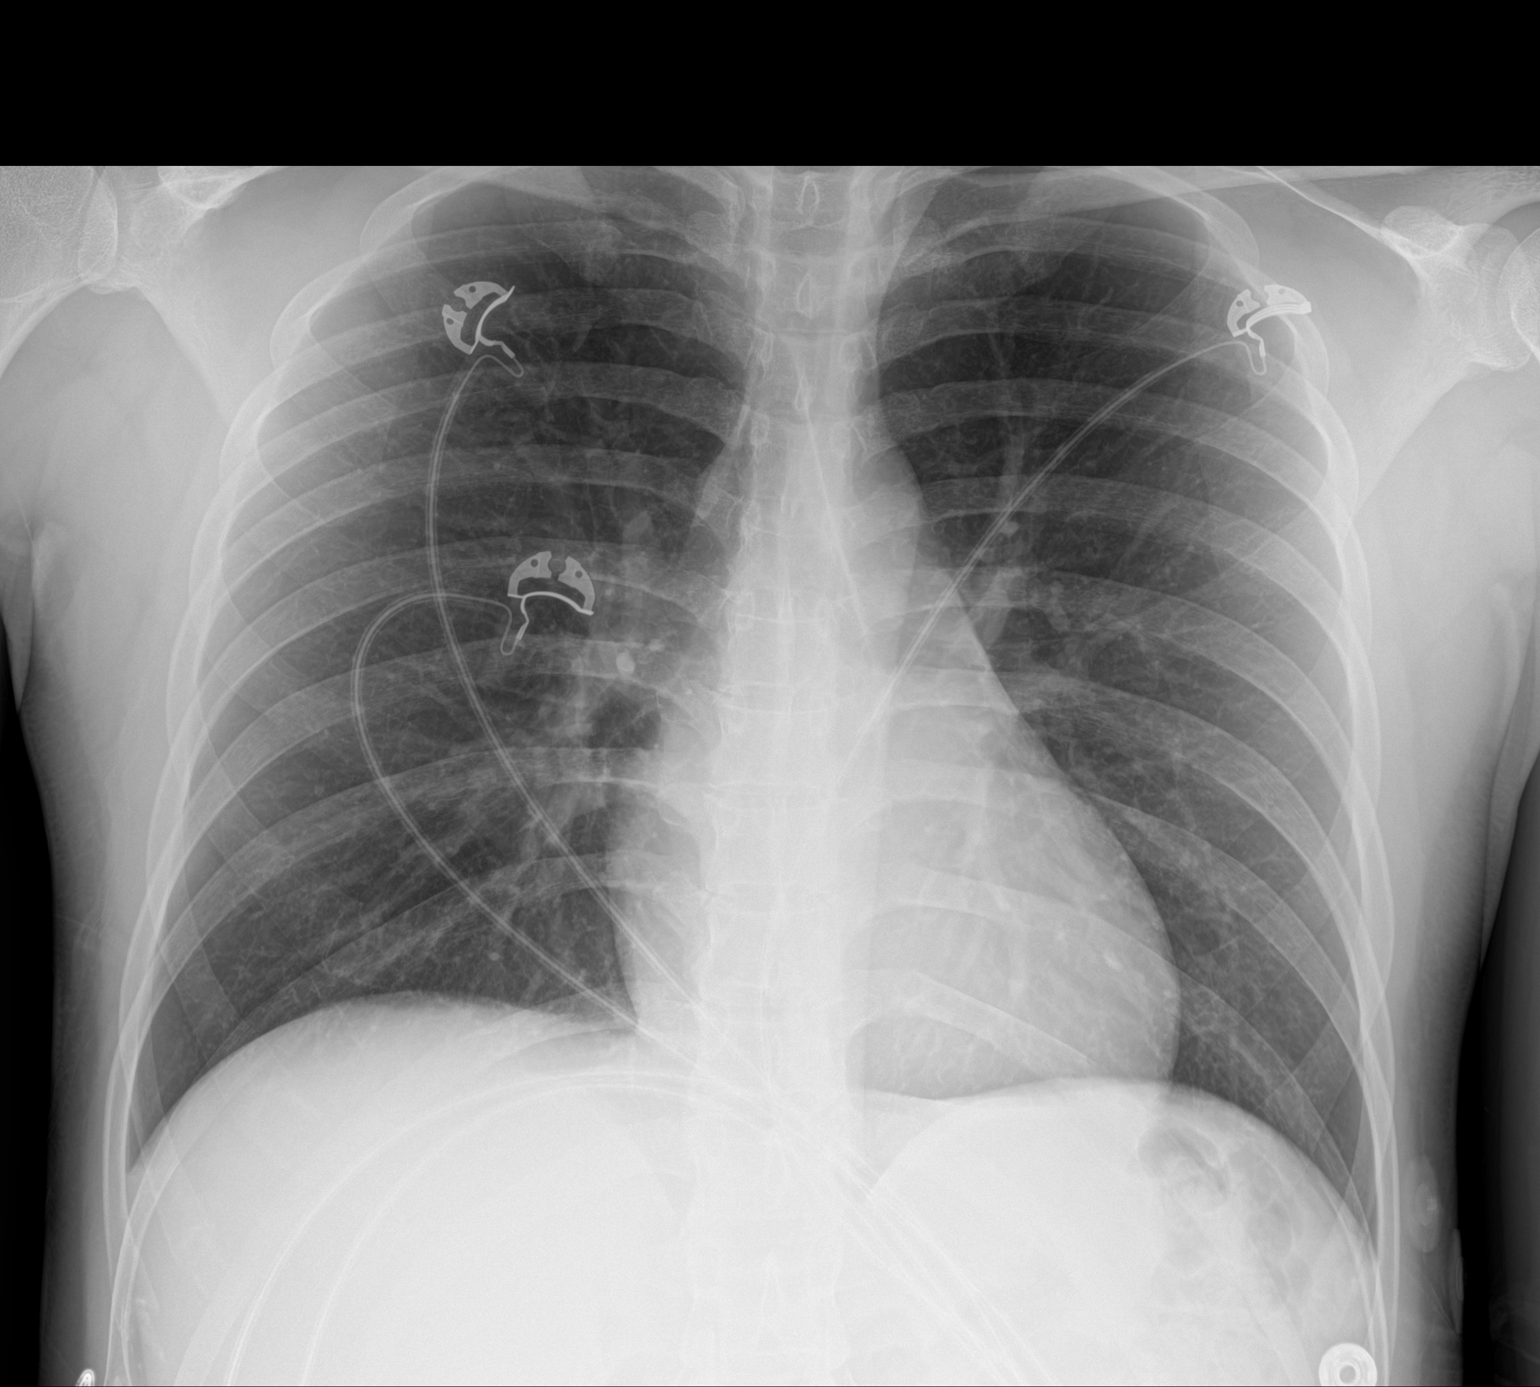

[1 of 1 positions shown; findings below may reference images not displayed]

FINDINGS: The heart size and mediastinal contours are within normal limits.
Both lungs are clear. The visualized skeletal structures are
unremarkable.
IMPRESSION: No active disease.

## 2022-01-28 IMAGING — CR DG CHEST 2V
2 series · 2 of 2 positions shown · non-contrast
Comparison: 06/19/2020

CLINICAL DATA: Chest pain

EXAM:
CHEST - 2 VIEW

[chest pa]
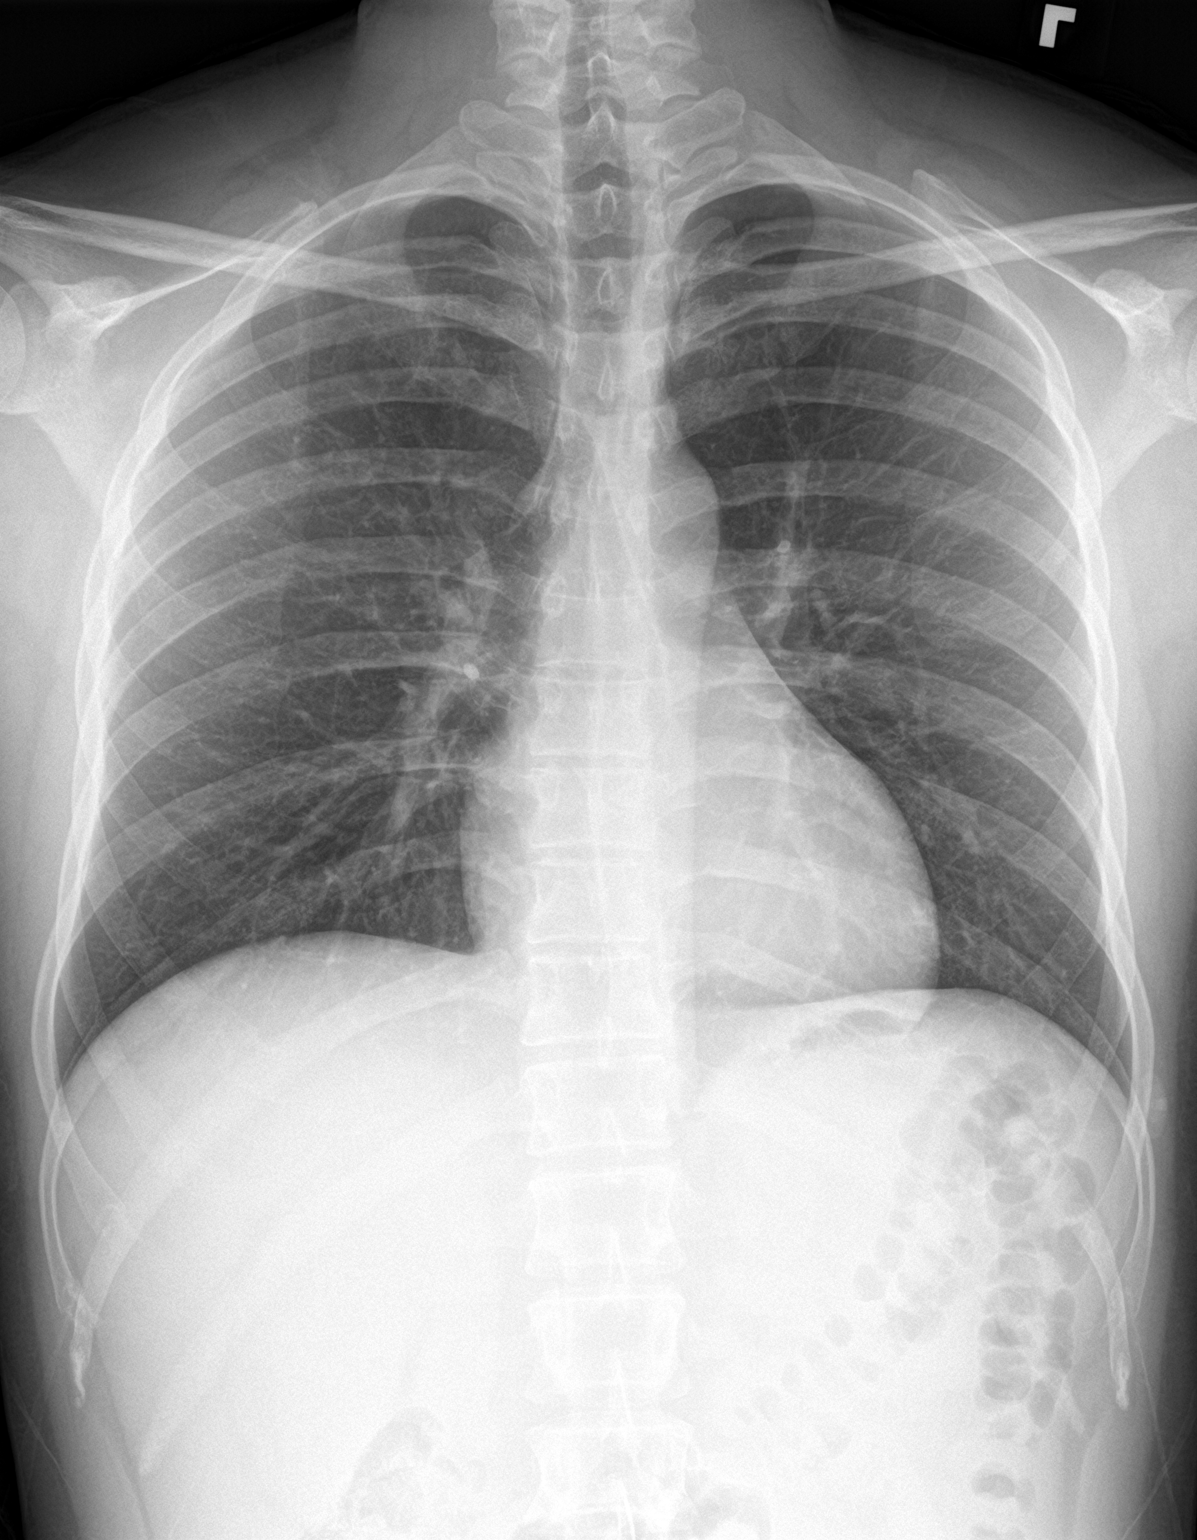

[chest lat]
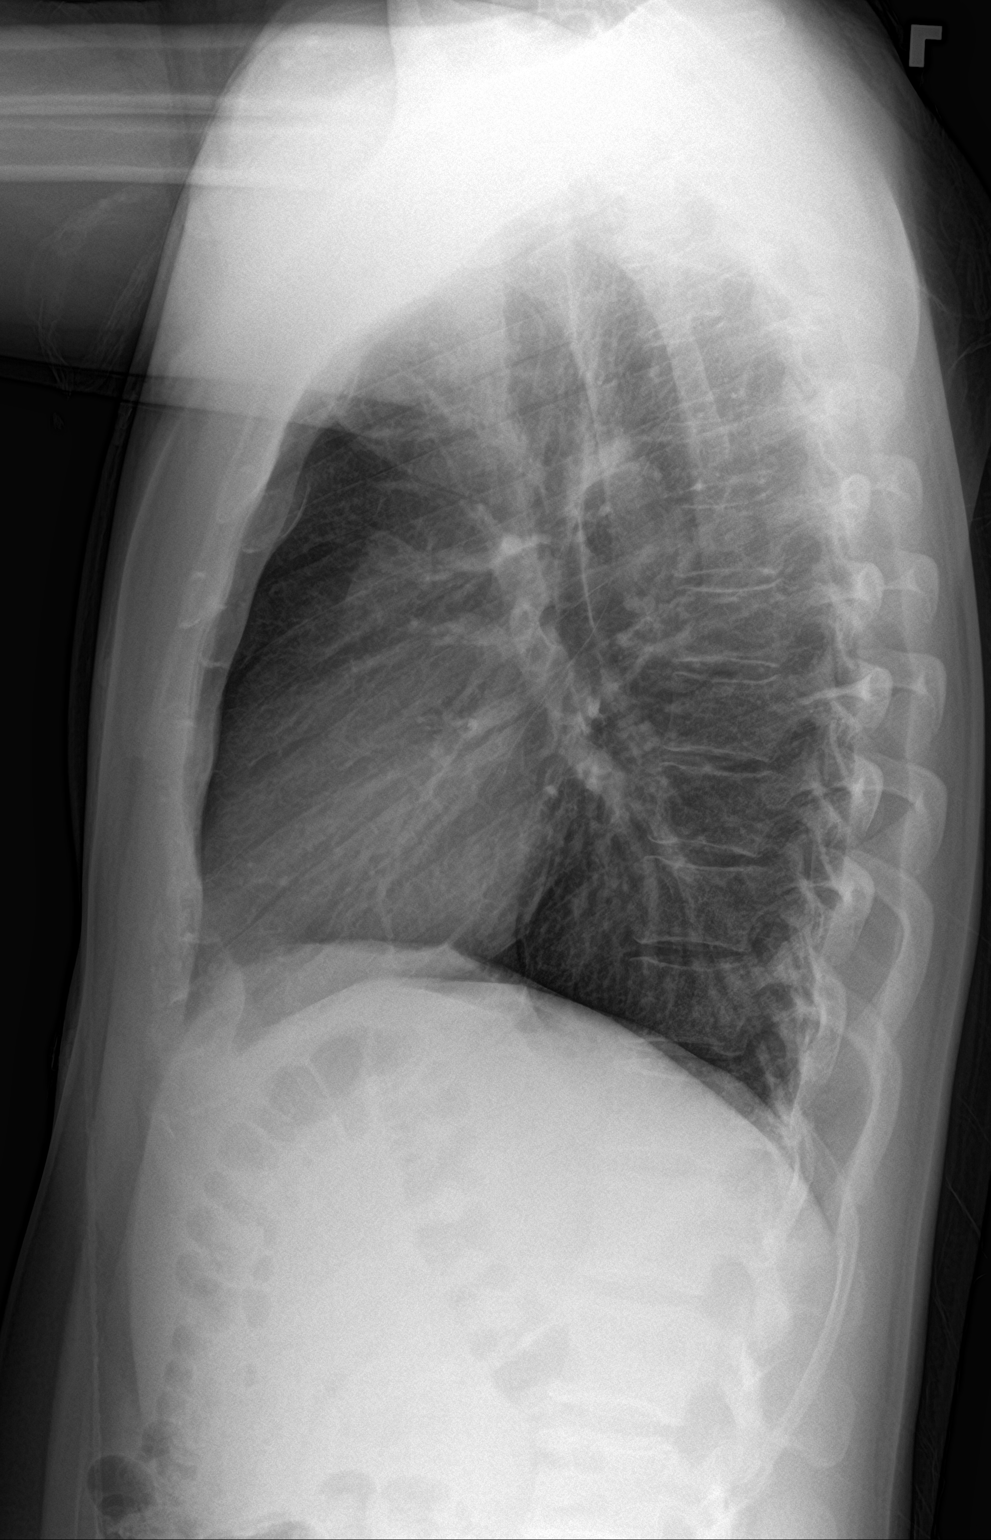

[2 of 2 positions shown; findings below may reference images not displayed]

FINDINGS: The heart size and mediastinal contours are within normal limits.
Both lungs are clear. The visualized skeletal structures are
unremarkable.
IMPRESSION: No active cardiopulmonary disease.
